# Patient Record
Sex: Male | Born: 2000 | Race: Black or African American | Hispanic: No | Marital: Single | Smoking: Never smoker
Health system: Southern US, Community
[De-identification: ages and names within clinical notes are randomized; demographics above are authoritative.]

---

## 2020-05-19 ENCOUNTER — Encounter (HOSPITAL_COMMUNITY): Payer: Self-pay | Admitting: Emergency Medicine

## 2020-05-19 ENCOUNTER — Other Ambulatory Visit: Payer: Self-pay

## 2020-05-19 ENCOUNTER — Emergency Department (HOSPITAL_COMMUNITY)
Admission: EM | Admit: 2020-05-19 | Discharge: 2020-05-19 | Disposition: A | Discharge status: Home / Self Care | Payer: Self-pay | Attending: Emergency Medicine | Admitting: Emergency Medicine

## 2020-05-19 ENCOUNTER — Emergency Department (HOSPITAL_COMMUNITY): Payer: Self-pay

## 2020-05-19 DIAGNOSIS — U071 COVID-19: Secondary | ICD-10-CM | POA: Insufficient documentation

## 2020-05-19 DIAGNOSIS — Z20822 Contact with and (suspected) exposure to covid-19: Secondary | ICD-10-CM

## 2020-05-19 DIAGNOSIS — R0602 Shortness of breath: Secondary | ICD-10-CM

## 2020-05-19 LAB — SARS CORONAVIRUS 2 (TAT 6-24 HRS): SARS Coronavirus 2: POSITIVE — AB

## 2020-05-19 MED ORDER — ALBUTEROL SULFATE HFA 108 (90 BASE) MCG/ACT IN AERS
2.0000 | INHALATION_SPRAY | Freq: Once | RESPIRATORY_TRACT | Status: AC
  Administered 2020-05-19: 2 via RESPIRATORY_TRACT
  Filled 2020-05-19: qty 6.7

## 2020-05-19 MED ORDER — DEXAMETHASONE SODIUM PHOSPHATE 10 MG/ML IJ SOLN
10.0000 mg | Freq: Once | INTRAMUSCULAR | Status: AC
  Administered 2020-05-19: 10 mg via INTRAMUSCULAR
  Filled 2020-05-19: qty 1

## 2020-05-19 MED ORDER — ALBUTEROL SULFATE HFA 108 (90 BASE) MCG/ACT IN AERS
2.0000 | INHALATION_SPRAY | RESPIRATORY_TRACT | Status: DC
  Filled 2020-05-19: qty 6.7

## 2020-05-19 NOTE — Discharge Instructions (Addendum)
You were seen today with concerns for COVID-19.  Check MyChart for your results.  In the meantime quarantine for at least 5 days or until you are symptom-free for over 24 hours.  Take Tylenol Motrin for body aches or pain.  Make sure you stay hydrated.

## 2020-05-19 NOTE — ED Triage Notes (Signed)
Patient reports central chest pain with SOB this evening , no cough or fever , denies emesis or diaphoresis .

## 2020-05-19 NOTE — ED Provider Notes (Addendum)
MSE was initiated and I personally evaluated the patient and placed orders (if any) at  1:54 AM on May 19, 2020.  Patient here with cough, subjective fevers, chills, chest tightness.  Reports that his friends all tested positive for COVID.  No treatments PTA.  No respiratory distress.  COVID and CXR ordered.  Given MDI.  RN tells me no 15 min covid tests available.  Regular 6-24hr covid ordered.  Discussed with patient that their care has been initiated.   They are counseled that they will need to remain in the ED until the completion of their workup, including full H&P and results of any tests.  Risks of leaving the emergency department prior to completion of treatment were discussed. Patient was advised to inform ED staff if they are leaving before their treatment is complete. The patient acknowledged these risks and time was allowed for questions.    The patient appears stable so that the remainder of the MSE may be completed by another provider.    Roxy Horseman, PA-C 05/19/20 0155    Roxy Horseman, PA-C 05/19/20 0157    Shon Baton, MD 05/19/20 [phone]

## 2020-05-19 NOTE — ED Provider Notes (Signed)
[facility] EMERGENCY DEPARTMENT Provider Note   CSN: [id number] Arrival date & time: 05/19/20  0136     History Chief Complaint  Patient presents with  . Chest Pain    SOB    James Buckley is a 20 y.o. male.  HPI     This a 20 year old male who presents with shortness of breath.  Patient reports he had onset of symptoms yesterday.  He has multiple friends who have tested positive for COVID-19.  He states that he suspects he has COVID-19.  He was treating his symptoms at home with Tylenol Motrin.  However overnight he woke up and felt like he could not catch his breath.  He had some anterior chest pressure.  No positional nature to the pain.  Currently he is pain-free.  He has been afebrile but has felt chilled.  He is vaccinated.  History reviewed. No pertinent past medical history.  There are no problems to display for this patient.   History reviewed. No pertinent surgical history.     No family history on file.  Social History   Tobacco Use  . Smoking status: Never Smoker  . Smokeless tobacco: Never Used  Substance Use Topics  . Alcohol use: Never  . Drug use: Never    Home Medications Prior to Admission medications   Not on File    Allergies    Patient has no known allergies.  Review of Systems   Review of Systems  Constitutional: Positive for chills. Negative for fever.  HENT: Positive for congestion.   Respiratory: Positive for cough and shortness of breath.   Cardiovascular: Negative for chest pain.  Gastrointestinal: Negative for abdominal pain and diarrhea.  Genitourinary: Negative for dysuria.  All other systems reviewed and are negative.   Physical Exam Updated Vital Signs BP (!) 141/86   Pulse 97   Temp 99.1 F (37.3 C) (Oral)   Resp 20   Ht 1.854 m (6\' 1" )   Wt 85 kg   SpO2 97%   BMI 24.72 kg/m   Physical Exam Vitals and nursing note reviewed.  Constitutional:      Appearance: He is well-developed. He is not  ill-appearing.  HENT:     Head: Normocephalic and atraumatic.  Eyes:     Pupils: Pupils are equal, round, and reactive to light.  Cardiovascular:     Rate and Rhythm: Normal rate and regular rhythm.     Heart sounds: Normal heart sounds. No murmur heard.   Pulmonary:     Effort: Pulmonary effort is normal. No respiratory distress.     Breath sounds: Normal breath sounds. No wheezing.  Abdominal:     General: Bowel sounds are normal.     Palpations: Abdomen is soft.     Tenderness: There is no abdominal tenderness. There is no rebound.  Musculoskeletal:     Cervical back: Neck supple.     Right lower leg: No tenderness. No edema.     Left lower leg: No tenderness. No edema.  Lymphadenopathy:     Cervical: No cervical adenopathy.  Skin:    General: Skin is warm and dry.  Neurological:     Mental Status: He is alert and oriented to person, place, and time.  Psychiatric:        Mood and Affect: Mood normal.     ED Results / Procedures / Treatments   Labs (all labs ordered are listed, but only abnormal results are displayed) Labs Reviewed  SARS  CORONAVIRUS 2 (TAT 6-24 HRS)    EKG None  Radiology DG Chest 2 View  Result Date: 05/19/2020 CLINICAL DATA:  Chest pain and shortness of breath EXAM: CHEST - 2 VIEW COMPARISON:  None. FINDINGS: The heart size and mediastinal contours are within normal limits. Both lungs are clear. The visualized skeletal structures are unremarkable. IMPRESSION: No active cardiopulmonary disease. Electronically Signed   By: Alcide Clever M.D.   On: 05/19/2020 02:27    Procedures Procedures   Medications Ordered in ED Medications  albuterol (VENTOLIN HFA) 108 (90 Base) MCG/ACT inhaler 2 puff (0 puffs Inhalation Hold 05/19/20 0627)  dexamethasone (DECADRON) injection 10 mg (10 mg Intramuscular Given 05/19/20 5361)  albuterol (VENTOLIN HFA) 108 (90 Base) MCG/ACT inhaler 2 puff (2 puffs Inhalation Given 05/19/20 0631)    ED Course  I have reviewed  the triage vital signs and the nursing notes.  Pertinent labs & imaging results that were available during my care of the patient were reviewed by me and considered in my medical decision making (see chart for details).    MDM Rules/Calculators/A&P                          Patient presents today with chest discomfort and shortness of breath.  Reports known sick contacts with COVID-19.  Reports upper respiratory symptoms.  He is nontoxic.  He is currently symptom-free.  COVID testing is pending but it is a 6 to 24-hour test.  Chest x-ray shows no evidence of acute pneumonia, pneumothorax, cardiomegaly.  His cardiac exam is normal without murmur.  EKG shows some diffuse ST segment elevations likely related to early repolarization.  I do not see any PR depressions to suggest pericarditis.  Recommend supportive measures at home.  Patient has access to MyChart.  He will check for his COVID results.  Quarantine for at least 5 days.  After history, exam, and medical workup I feel the patient has been appropriately medically screened and is safe for discharge home. Pertinent diagnoses were discussed with the patient. Patient was given return precautions.  Final Clinical Impression(s) / ED Diagnoses Final diagnoses:  Suspected 2019 novel coronavirus infection  SOB (shortness of breath)    Rx / DC Orders ED Discharge Orders    None       Quantavia Frith, Mayer Masker, MD 05/19/20 [phone]

## 2021-09-02 IMAGING — DX DG CHEST 2V
2 series · 2 of 2 positions shown · non-contrast
Comparison: None.

CLINICAL DATA: Chest pain and shortness of breath

EXAM:
CHEST - 2 VIEW

[chest pa]
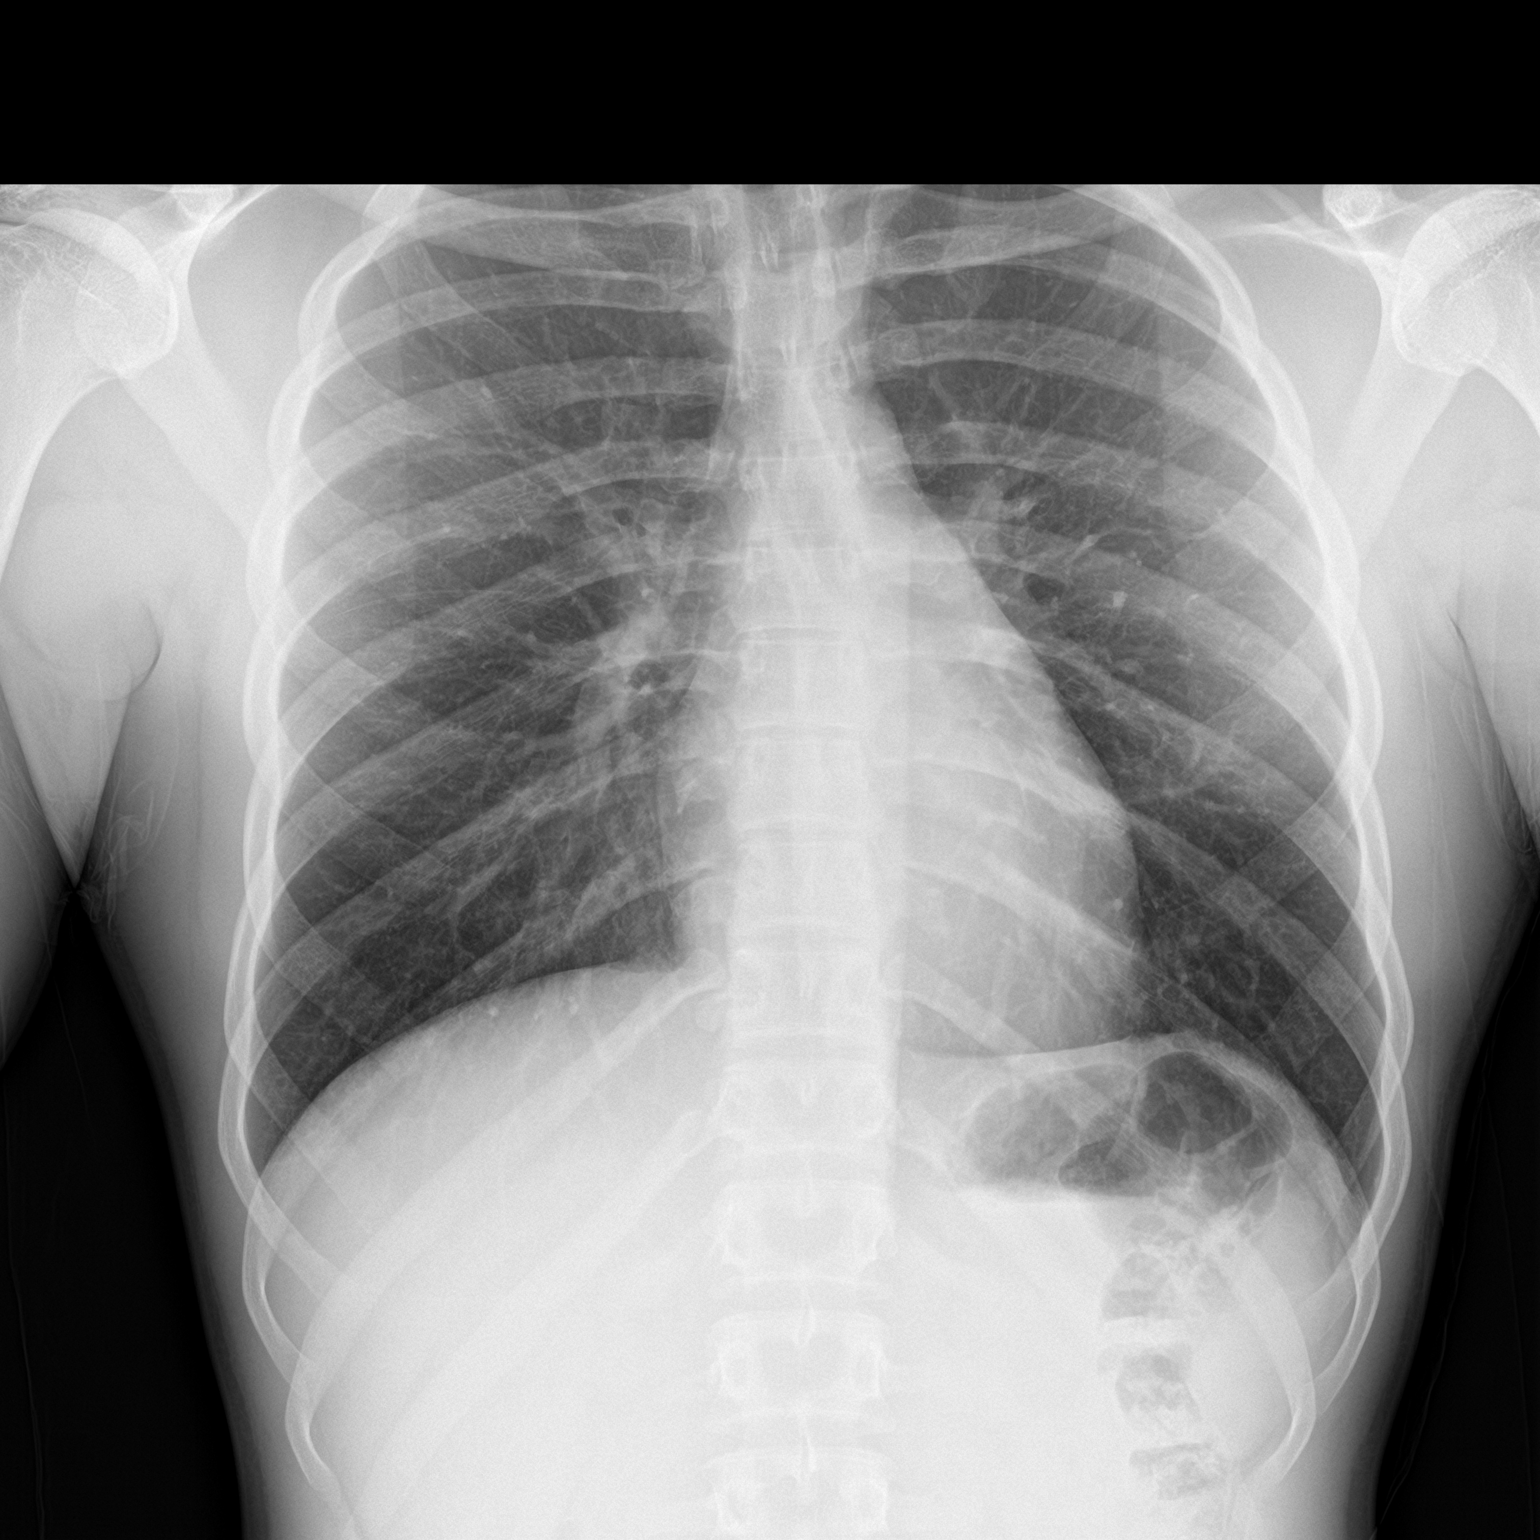

[chest lat]
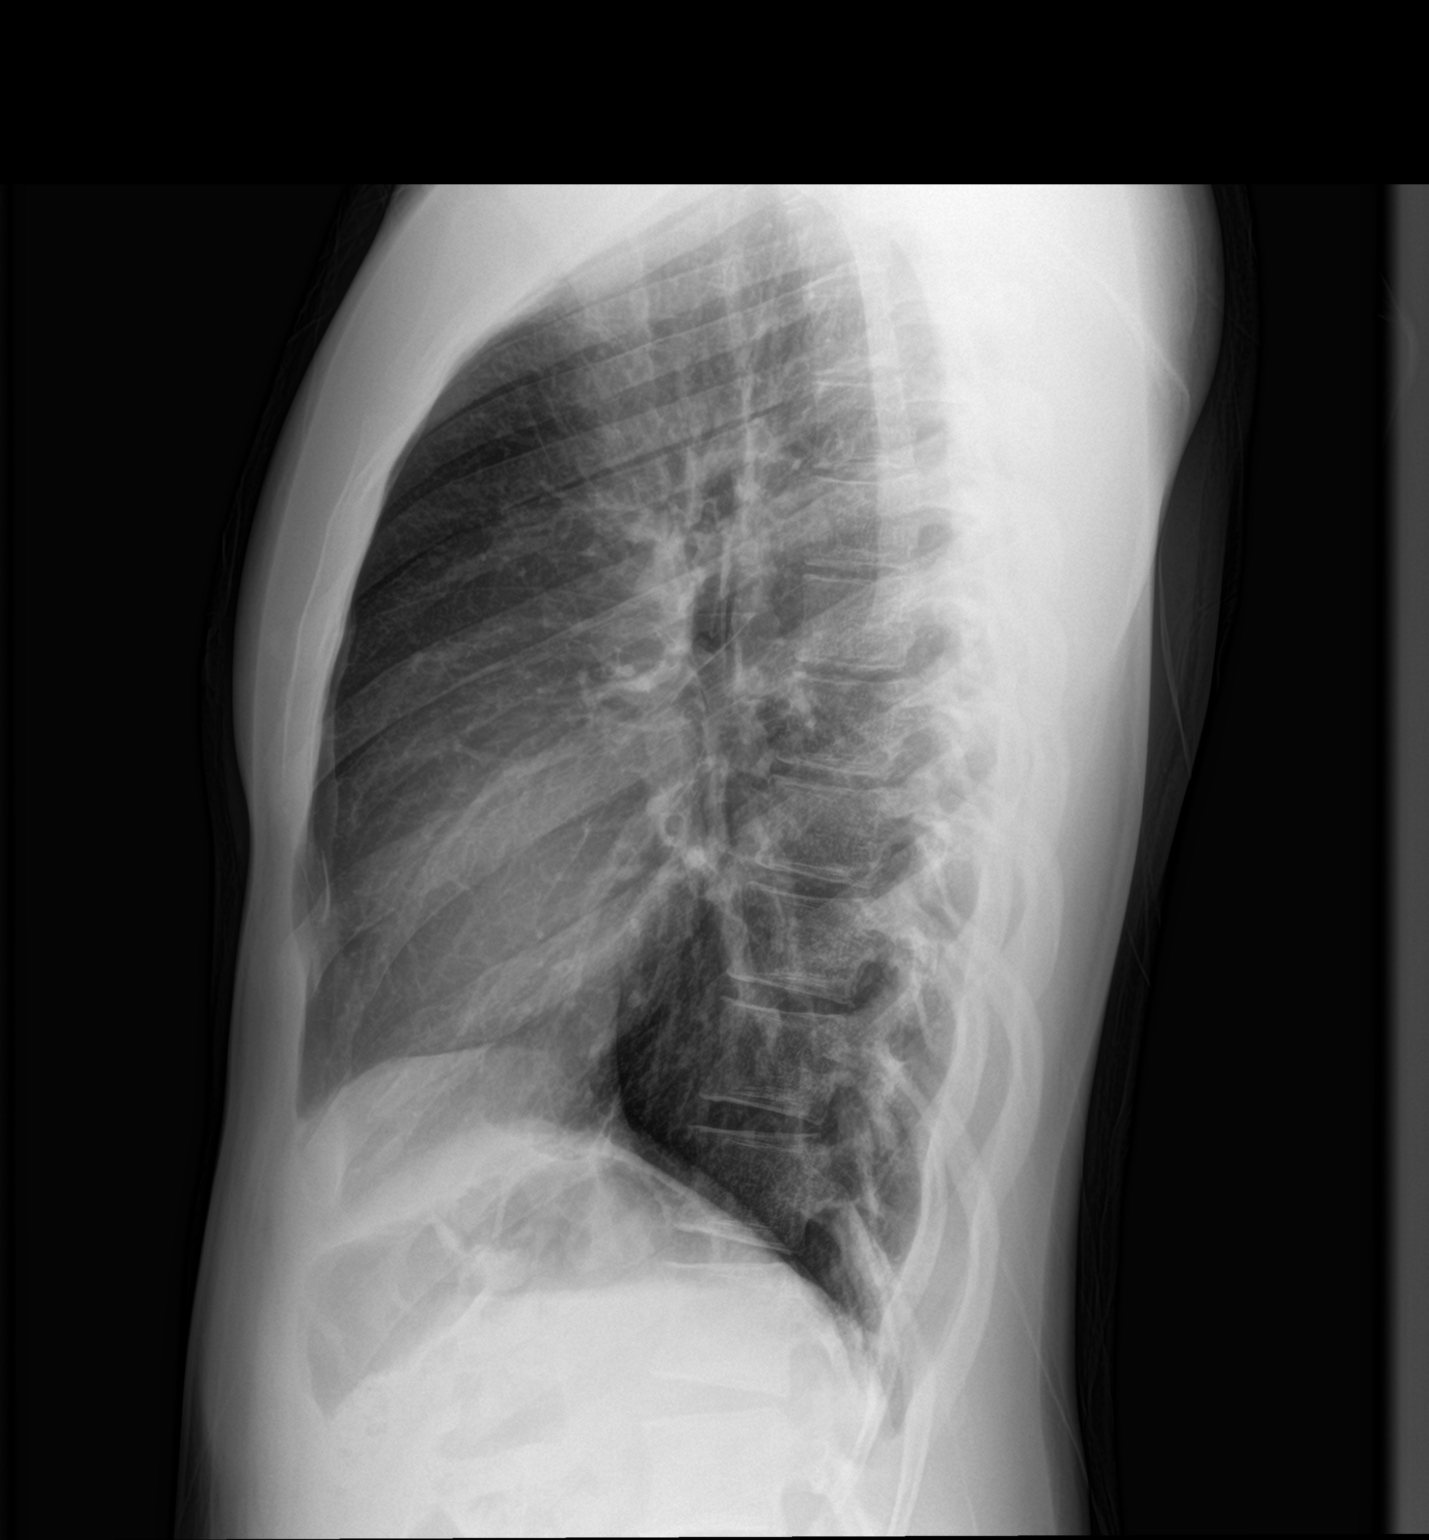

[2 of 2 positions shown; findings below may reference images not displayed]

FINDINGS: The heart size and mediastinal contours are within normal limits.
Both lungs are clear. The visualized skeletal structures are
unremarkable.
IMPRESSION: No active cardiopulmonary disease.
